# Patient Record
Sex: Female | Born: 1985 | Hispanic: No | Marital: Married | Smoking: Never smoker
Health system: Southern US, Community
[De-identification: ages and names within clinical notes are randomized; demographics above are authoritative.]

## PROBLEM LIST (undated history)

## (undated) DIAGNOSIS — Z789 Other specified health status: Secondary | ICD-10-CM

## (undated) HISTORY — DX: Other specified health status: Z78.9

## (undated) HISTORY — PX: NO PAST SURGERIES: SHX2092

---

## 2019-09-19 LAB — OB RESULTS CONSOLE RPR: RPR: NONREACTIVE

## 2019-09-19 LAB — OB RESULTS CONSOLE ANTIBODY SCREEN: Antibody Screen: NEGATIVE

## 2019-09-19 LAB — OB RESULTS CONSOLE GC/CHLAMYDIA
Chlamydia: NEGATIVE
Gonorrhea: NEGATIVE

## 2019-09-19 LAB — OB RESULTS CONSOLE HIV ANTIBODY (ROUTINE TESTING): HIV: NONREACTIVE

## 2019-09-19 LAB — OB RESULTS CONSOLE HEPATITIS B SURFACE ANTIGEN: Hepatitis B Surface Ag: NEGATIVE

## 2019-09-19 LAB — OB RESULTS CONSOLE ABO/RH: RH Type: POSITIVE

## 2019-09-19 LAB — OB RESULTS CONSOLE RUBELLA ANTIBODY, IGM: Rubella: IMMUNE

## 2019-10-18 NOTE — L&D Delivery Note (Signed)
Delivery Note Complete at 3 pm and pushing since 3.10 pm. Tight perineum. At 4:23 PM a viable and healthy female was delivered via Vaginal, Spontaneous (Presentation: Right Occiput Anterior).  APGAR: 9, 9; weight  -pending    Retained placenta at 30 min, no signs of separation/ bleeding, tried stopping pitocin to allow cervix to open back up with no success. . Dr Alberteen Spindle and Annabelle Harman, CRNA called in with Nitroglycerine. Seven sprays of sublingual Nitroglycerine given gradually while monitoring vitals and gave 500 cc LR bolus, she also received some additional Lidocaine in epidural by Dr Hyacinth Meeker.  Placenta status: Retained; Manual removal. Placenta removed piecemeal, Uterine fundal exploration done to complete removal of any retained pieces.  2 gm Ancef given for infection prophylaxis.   Post=op vital stable, no active bleeding. Uterus firm, pitocin restarted.  BP 111/62   Pulse 74   Temp 97.7 F (36.5 C) (Oral)   Resp 16   Ht 5\' 5"  (1.651 m)   Wt 72.4 kg   SpO2 100%   BMI 26.56 kg/m   Cord: 3 vessels with the following complications: None.  Cord pH: NA Anesthesia: Epidural Episiotomy: None Lacerations: 1st degree perineal ;2nd degree vaginal  Suture Repair: 3.0 vicryl rapide Est. Blood Loss (mL):  550  Mom to postpartum.  Baby to Couplet care / Skin to Skin.  04/12/2020, 5:31 PM

## 2020-03-25 LAB — OB RESULTS CONSOLE GBS: GBS: NEGATIVE

## 2020-04-02 ENCOUNTER — Encounter (HOSPITAL_COMMUNITY): Payer: Self-pay | Admitting: *Deleted

## 2020-04-02 ENCOUNTER — Telehealth (HOSPITAL_COMMUNITY): Payer: Self-pay | Admitting: *Deleted

## 2020-04-02 NOTE — Telephone Encounter (Signed)
Preadmission screen  

## 2020-04-06 ENCOUNTER — Encounter (HOSPITAL_COMMUNITY): Payer: Self-pay | Admitting: *Deleted

## 2020-04-06 ENCOUNTER — Telehealth (HOSPITAL_COMMUNITY): Payer: Self-pay | Admitting: *Deleted

## 2020-04-06 NOTE — Telephone Encounter (Signed)
Preadmission screen  

## 2020-04-08 ENCOUNTER — Other Ambulatory Visit: Payer: Self-pay | Admitting: Obstetrics and Gynecology

## 2020-04-10 ENCOUNTER — Other Ambulatory Visit (HOSPITAL_COMMUNITY)
Admission: RE | Admit: 2020-04-10 | Discharge: 2020-04-10 | Disposition: A | Discharge status: Home / Self Care | Payer: Federal, State, Local not specified - PPO | Source: Ambulatory Visit | Attending: Obstetrics and Gynecology | Admitting: Obstetrics and Gynecology

## 2020-04-10 DIAGNOSIS — Z01812 Encounter for preprocedural laboratory examination: Secondary | ICD-10-CM | POA: Insufficient documentation

## 2020-04-10 DIAGNOSIS — Z20822 Contact with and (suspected) exposure to covid-19: Secondary | ICD-10-CM | POA: Insufficient documentation

## 2020-04-10 LAB — SARS CORONAVIRUS 2 (TAT 6-24 HRS): SARS Coronavirus 2: NEGATIVE

## 2020-04-12 ENCOUNTER — Inpatient Hospital Stay (HOSPITAL_COMMUNITY): Payer: Federal, State, Local not specified - PPO | Admitting: Anesthesiology

## 2020-04-12 ENCOUNTER — Inpatient Hospital Stay (HOSPITAL_COMMUNITY)
Admission: RE | Admit: 2020-04-12 | Discharge: 2020-04-14 | DRG: 797 | Disposition: A | Discharge status: Home / Self Care | Payer: Federal, State, Local not specified - PPO | Attending: Obstetrics and Gynecology | Admitting: Obstetrics and Gynecology

## 2020-04-12 ENCOUNTER — Other Ambulatory Visit: Payer: Self-pay

## 2020-04-12 ENCOUNTER — Encounter (HOSPITAL_COMMUNITY): Payer: Self-pay | Admitting: Obstetrics and Gynecology

## 2020-04-12 ENCOUNTER — Inpatient Hospital Stay (HOSPITAL_COMMUNITY): Payer: Federal, State, Local not specified - PPO

## 2020-04-12 DIAGNOSIS — Z3490 Encounter for supervision of normal pregnancy, unspecified, unspecified trimester: Secondary | ICD-10-CM | POA: Diagnosis present

## 2020-04-12 DIAGNOSIS — Z3A39 39 weeks gestation of pregnancy: Secondary | ICD-10-CM

## 2020-04-12 DIAGNOSIS — D62 Acute posthemorrhagic anemia: Secondary | ICD-10-CM | POA: Diagnosis not present

## 2020-04-12 DIAGNOSIS — Z20822 Contact with and (suspected) exposure to covid-19: Secondary | ICD-10-CM | POA: Diagnosis present

## 2020-04-12 DIAGNOSIS — O365930 Maternal care for other known or suspected poor fetal growth, third trimester, not applicable or unspecified: Secondary | ICD-10-CM | POA: Diagnosis present

## 2020-04-12 DIAGNOSIS — O9902 Anemia complicating childbirth: Secondary | ICD-10-CM | POA: Diagnosis present

## 2020-04-12 LAB — CBC
HCT: 36 % (ref 36.0–46.0)
Hemoglobin: 11.6 g/dL — ABNORMAL LOW (ref 12.0–15.0)
MCH: 28.9 pg (ref 26.0–34.0)
MCHC: 32.2 g/dL (ref 30.0–36.0)
MCV: 89.6 fL (ref 80.0–100.0)
Platelets: 170 10*3/uL (ref 150–400)
RBC: 4.02 MIL/uL (ref 3.87–5.11)
RDW: 12.9 % (ref 11.5–15.5)
WBC: 10.2 10*3/uL (ref 4.0–10.5)
nRBC: 0 % (ref 0.0–0.2)

## 2020-04-12 LAB — ABO/RH: ABO/RH(D): O POS

## 2020-04-12 LAB — TYPE AND SCREEN
ABO/RH(D): O POS
Antibody Screen: NEGATIVE

## 2020-04-12 LAB — RPR: RPR Ser Ql: NONREACTIVE

## 2020-04-12 MED ORDER — CEFAZOLIN SODIUM-DEXTROSE 2-4 GM/100ML-% IV SOLN
INTRAVENOUS | Status: AC
  Filled 2020-04-12: qty 100

## 2020-04-12 MED ORDER — BENZOCAINE-MENTHOL 20-0.5 % EX AERO
1.0000 "application " | INHALATION_SPRAY | CUTANEOUS | Status: DC | PRN
  Administered 2020-04-12: 1 via TOPICAL
  Filled 2020-04-12: qty 56

## 2020-04-12 MED ORDER — FENTANYL-BUPIVACAINE-NACL 0.5-0.125-0.9 MG/250ML-% EP SOLN
12.0000 mL/h | EPIDURAL | Status: DC | PRN
  Filled 2020-04-12: qty 250

## 2020-04-12 MED ORDER — TETANUS-DIPHTH-ACELL PERTUSSIS 5-2.5-18.5 LF-MCG/0.5 IM SUSP: 0.5000 mL | Freq: Once | INTRAMUSCULAR | Status: DC

## 2020-04-12 MED ORDER — NITROGLYCERIN 0.4 MG/SPRAY TL SOLN
Status: DC | PRN
  Administered 2020-04-12 (×3): 2 via SUBLINGUAL

## 2020-04-12 MED ORDER — WITCH HAZEL-GLYCERIN EX PADS: 1.0000 "application " | MEDICATED_PAD | CUTANEOUS | Status: DC | PRN

## 2020-04-12 MED ORDER — DIBUCAINE (PERIANAL) 1 % EX OINT: 1.0000 "application " | TOPICAL_OINTMENT | CUTANEOUS | Status: DC | PRN

## 2020-04-12 MED ORDER — LIDOCAINE HCL (PF) 1 % IJ SOLN: 30.0000 mL | INTRAMUSCULAR | Status: DC | PRN

## 2020-04-12 MED ORDER — SODIUM BICARBONATE 8.4 % IV SOLN
INTRAVENOUS | Status: DC | PRN
  Administered 2020-04-12: 5 mL via EPIDURAL

## 2020-04-12 MED ORDER — SENNOSIDES-DOCUSATE SODIUM 8.6-50 MG PO TABS
2.0000 | ORAL_TABLET | ORAL | Status: DC
  Administered 2020-04-13: 2 via ORAL
  Filled 2020-04-12 (×2): qty 2

## 2020-04-12 MED ORDER — ONDANSETRON HCL 4 MG PO TABS: 4.0000 mg | ORAL_TABLET | ORAL | Status: DC | PRN

## 2020-04-12 MED ORDER — ACETAMINOPHEN 325 MG PO TABS
650.0000 mg | ORAL_TABLET | ORAL | Status: DC | PRN
  Administered 2020-04-13 – 2020-04-14 (×5): 650 mg via ORAL
  Filled 2020-04-12 (×5): qty 2

## 2020-04-12 MED ORDER — IBUPROFEN 600 MG PO TABS
600.0000 mg | ORAL_TABLET | Freq: Four times a day (QID) | ORAL | Status: DC
  Administered 2020-04-12 – 2020-04-14 (×7): 600 mg via ORAL
  Filled 2020-04-12 (×7): qty 1

## 2020-04-12 MED ORDER — OXYTOCIN-SODIUM CHLORIDE 30-0.9 UT/500ML-% IV SOLN
1.0000 m[IU]/min | INTRAVENOUS | Status: DC
  Filled 2020-04-12: qty 500

## 2020-04-12 MED ORDER — EPHEDRINE 5 MG/ML INJ: 10.0000 mg | INTRAVENOUS | Status: DC | PRN

## 2020-04-12 MED ORDER — LACTATED RINGERS IV SOLN
500.0000 mL | Freq: Once | INTRAVENOUS | Status: AC
  Administered 2020-04-12: 500 mL via INTRAVENOUS

## 2020-04-12 MED ORDER — BUTORPHANOL TARTRATE 1 MG/ML IJ SOLN
INTRAMUSCULAR | Status: AC
  Filled 2020-04-12: qty 1

## 2020-04-12 MED ORDER — PHENYLEPHRINE 40 MCG/ML (10ML) SYRINGE FOR IV PUSH (FOR BLOOD PRESSURE SUPPORT): 80.0000 ug | PREFILLED_SYRINGE | INTRAVENOUS | Status: DC | PRN

## 2020-04-12 MED ORDER — PHENYLEPHRINE HCL (PRESSORS) 10 MG/ML IV SOLN
INTRAVENOUS | Status: DC | PRN
Start: 2020-04-12 — End: 2020-04-12
  Administered 2020-04-12 (×3): 40 ug via INTRAVENOUS

## 2020-04-12 MED ORDER — SOD CITRATE-CITRIC ACID 500-334 MG/5ML PO SOLN: 30.0000 mL | ORAL | Status: DC | PRN

## 2020-04-12 MED ORDER — NITROGLYCERIN 0.4 MG/SPRAY TL SOLN
Status: AC
  Filled 2020-04-12: qty 4.9

## 2020-04-12 MED ORDER — BUTORPHANOL TARTRATE 1 MG/ML IJ SOLN
1.0000 mg | INTRAMUSCULAR | Status: DC | PRN
  Administered 2020-04-12: 1 mg via INTRAVENOUS

## 2020-04-12 MED ORDER — OXYTOCIN-SODIUM CHLORIDE 30-0.9 UT/500ML-% IV SOLN: 2.5000 [IU]/h | INTRAVENOUS | Status: DC

## 2020-04-12 MED ORDER — TERBUTALINE SULFATE 1 MG/ML IJ SOLN
0.2500 mg | Freq: Once | INTRAMUSCULAR | Status: AC | PRN
  Administered 2020-04-12: 0.25 mg via SUBCUTANEOUS
  Filled 2020-04-12: qty 1

## 2020-04-12 MED ORDER — CEFAZOLIN SODIUM-DEXTROSE 2-4 GM/100ML-% IV SOLN: 2.0000 g | Freq: Three times a day (TID) | INTRAVENOUS | Status: DC

## 2020-04-12 MED ORDER — CEFAZOLIN SODIUM-DEXTROSE 2-4 GM/100ML-% IV SOLN
2.0000 g | Freq: Once | INTRAVENOUS | Status: AC
  Administered 2020-04-12: 2 g via INTRAVENOUS

## 2020-04-12 MED ORDER — DIPHENHYDRAMINE HCL 25 MG PO CAPS: 25.0000 mg | ORAL_CAPSULE | Freq: Four times a day (QID) | ORAL | Status: DC | PRN

## 2020-04-12 MED ORDER — SIMETHICONE 80 MG PO CHEW: 80.0000 mg | CHEWABLE_TABLET | ORAL | Status: DC | PRN

## 2020-04-12 MED ORDER — DIPHENHYDRAMINE HCL 50 MG/ML IJ SOLN: 12.5000 mg | INTRAMUSCULAR | Status: DC | PRN

## 2020-04-12 MED ORDER — LACTATED RINGERS IV SOLN: INTRAVENOUS | Status: DC

## 2020-04-12 MED ORDER — LIDOCAINE-EPINEPHRINE (PF) 2 %-1:200000 IJ SOLN
INTRAMUSCULAR | Status: AC
  Filled 2020-04-12: qty 10

## 2020-04-12 MED ORDER — ONDANSETRON HCL 4 MG/2ML IJ SOLN
4.0000 mg | Freq: Four times a day (QID) | INTRAMUSCULAR | Status: DC | PRN
  Administered 2020-04-12: 4 mg via INTRAVENOUS
  Filled 2020-04-12: qty 2

## 2020-04-12 MED ORDER — SODIUM CHLORIDE (PF) 0.9 % IJ SOLN
INTRAMUSCULAR | Status: DC | PRN
  Administered 2020-04-12: 12 mL/h via EPIDURAL

## 2020-04-12 MED ORDER — LIDOCAINE HCL (PF) 1 % IJ SOLN
INTRAMUSCULAR | Status: DC | PRN
  Administered 2020-04-12: 11 mL via EPIDURAL

## 2020-04-12 MED ORDER — PHENYLEPHRINE 40 MCG/ML (10ML) SYRINGE FOR IV PUSH (FOR BLOOD PRESSURE SUPPORT)
PREFILLED_SYRINGE | INTRAVENOUS | Status: AC
  Filled 2020-04-12: qty 10

## 2020-04-12 MED ORDER — OXYTOCIN BOLUS FROM INFUSION
333.0000 mL | Freq: Once | INTRAVENOUS | Status: AC
  Administered 2020-04-12 (×2): 333 mL via INTRAVENOUS

## 2020-04-12 MED ORDER — BUTORPHANOL TARTRATE 1 MG/ML IJ SOLN
1.0000 mg | Freq: Once | INTRAMUSCULAR | Status: AC
  Administered 2020-04-12: 1 mg via INTRAVENOUS
  Filled 2020-04-12: qty 1

## 2020-04-12 MED ORDER — MISOPROSTOL 25 MCG QUARTER TABLET
25.0000 ug | ORAL_TABLET | ORAL | Status: DC | PRN
  Administered 2020-04-12: 25 ug via VAGINAL
  Filled 2020-04-12: qty 1

## 2020-04-12 MED ORDER — PRENATAL MULTIVITAMIN CH
1.0000 | ORAL_TABLET | Freq: Every day | ORAL | Status: DC
  Administered 2020-04-13 – 2020-04-14 (×2): 1 via ORAL
  Filled 2020-04-12 (×2): qty 1

## 2020-04-12 MED ORDER — LACTATED RINGERS IV SOLN
500.0000 mL | INTRAVENOUS | Status: DC | PRN
  Administered 2020-04-12: 1000 mL via INTRAVENOUS

## 2020-04-12 MED ORDER — COCONUT OIL OIL: 1.0000 "application " | TOPICAL_OIL | Status: DC | PRN

## 2020-04-12 MED ORDER — ACETAMINOPHEN 325 MG PO TABS
650.0000 mg | ORAL_TABLET | ORAL | Status: DC | PRN
  Administered 2020-04-12 (×2): 650 mg via ORAL
  Filled 2020-04-12 (×2): qty 2

## 2020-04-12 MED ORDER — PHENYLEPHRINE 40 MCG/ML (10ML) SYRINGE FOR IV PUSH (FOR BLOOD PRESSURE SUPPORT)
80.0000 ug | PREFILLED_SYRINGE | INTRAVENOUS | Status: DC | PRN
  Filled 2020-04-12: qty 10

## 2020-04-12 MED ORDER — ONDANSETRON HCL 4 MG/2ML IJ SOLN: 4.0000 mg | INTRAMUSCULAR | Status: DC | PRN

## 2020-04-12 NOTE — Lactation Note (Signed)
This note was copied from a baby's chart. Lactation Consultation Note  Patient Name: Kayla Gordon IWLNL'G Date: 04/12/2020 Reason for consult: Initial assessment;1st time breastfeeding;Term;Infant < 6lbs P1, 6 hour term female infant, borderline IUGR.  Mom with hx: PCOS and Anemia. Mom will work on latching infant at breast, will hand express and give back volume if infant not latching. Parent prefer to supplement infant with donor breastmilk if infant needs to be supplemented due to weight less than 6 lbs at birth, infant will be reassess tomorrow morning to see if supplement is needed. Mom will work on latching infant at breast, hand expressing , giving back volume and pumping.  Tools given : 20 mm NS by L&D and DEBP due infant not latching well currently and hx of PCOS Per mom, infant breastfeed with NS in L&D for 20 minutes. Mom attempted to latch infant but infant was reluctant to latch at this time, only did few suckles and mostly held breast in mouth. Mom used  20 mm NS and LC  inserted 0.5 ml of colostrum with curve tip syringe, mom BF infant on her left breast using the foot ball hold, infant suckle 2 minutes less and stopped.  Infant was given 5 mls of colostrum by spoon.  Mom will continue working towards latching infant at breast and will ask RN or LC for assistance with latching infant at breast if needed. Mom will use DEBP every 3 hours for 15 minutes on initial setting to help establish milk supply due to hx PCOS and infant not latching at breast currently. Mom shown how to use DEBP & how to disassemble, clean, & reassemble parts. Mom knows to breastfeed infant according hunger cues, 8 to 12+ times within 24 hours and not exceed 3 hours without BF infant. Mom made aware of O/P services, breastfeeding support groups, community resources, and our phone # for post-discharge questions.  Mom's plan: 1. Pre-pump breast prior to latching infant at breast will try to latch without  NS at first. 2. Will pump every 3 hours for 15 minutes on initial setting. 3. Parents will continue to do as much STS as possible.  Maternal Data Formula Feeding for Exclusion: No Has patient been taught Hand Expression?: Yes Does the patient have breastfeeding experience prior to this delivery?: No  Feeding Feeding Type: Breast Fed  LATCH Score Latch: Too sleepy or reluctant, no latch achieved, no sucking elicited.  Audible Swallowing: None  Type of Nipple: Everted at rest and after stimulation  Comfort (Breast/Nipple): Soft / non-tender  Hold (Positioning): Assistance needed to correctly position infant at breast and maintain latch.  LATCH Score: 5  Interventions Interventions: Breast feeding basics reviewed;Reverse pressure;Assisted with latch;Breast compression;Skin to skin;Adjust position;Breast massage;Support pillows;Hand express;Position options;Pre-pump if needed;Expressed milk;Hand pump;DEBP  Lactation Tools Discussed/Used Tools: Pump;Nipple Shields Nipple shield size: 20 Breast pump type: Double-Electric Breast Pump;Manual WIC Program: No Pump Review: Setup, frequency, and cleaning;Milk Storage Initiated by:: Kayla Gordon, IBCLC Date initiated:: 04/12/20   Consult Status Consult Status: Follow-up Date: 04/13/20 Follow-up type: In-patient    Kayla Gordon 04/12/2020, 11:25 PM

## 2020-04-12 NOTE — Progress Notes (Addendum)
Kayla Gordon is a 34 y.o. G1P0 at [redacted]w[redacted]d by ultrasound in 1st trim. admitted for induction of labor due to Elective at term, admitted by Dr Amado Nash.  Cytotec x 1 dose overnight, contracting well since then, so no further augmentation thus far. S/p 2 doses of Stadol earlier and now epidural since 10.30 am.  UCs spaced out at 12 noon, AROM at12.30 pm.clear fluid. UCs pattern became regular again  RN Called at 1.30 pm reporting few late decels with UCs q min,so Terbutaline 0.25 mg SQ given and FHT improved,no late decels noted.    Physical exam:  A&O x 3, no acute distress. Pleasant HEENT neg, no thyromegaly Lungs CTA bilat CV RRR, S1S2 normal Abdo soft, non tender, non acute Extr no edema/ tenderness  FHT:  FHR: 130 bpm, variability: moderate,  accelerations:  Present,  decelerations:  Absent UC:   regular, every 2-3 minutes, spontaneous  SVE:   Dilation: 3 Effacement (%): 90 Station: -2, -1 Exam by:: Dr Juliene Pina This exam was at 12.30 pm at the time of AROM. Mid pelvis borderline. Plan to use Peanut ball once further dilated and head in mid-pelvis   Assessment / Plan: IOL at term. Good early progress Labor: Progressing normally Fetal Wellbeing:  Category I Pain Control:  Epidural I/D:  n/a Anticipated MOD:  working towards SVD, continue to assess   Robley Fries 04/12/2020, 2:43 PM

## 2020-04-12 NOTE — Anesthesia Preprocedure Evaluation (Signed)

## 2020-04-12 NOTE — Anesthesia Procedure Notes (Signed)
Epidural Patient location during procedure: OB Start time: 04/12/2020 10:07 AM End time: 04/12/2020 10:22 AM  Staffing Anesthesiologist: Lowella Curb, MD Performed: anesthesiologist   Preanesthetic Checklist Completed: patient identified, IV checked, site marked, risks and benefits discussed, surgical consent, monitors and equipment checked, pre-op evaluation and timeout performed  Epidural Patient position: sitting Prep: ChloraPrep Patient monitoring: heart rate, cardiac monitor, continuous pulse ox and blood pressure Approach: midline Location: L2-L3 Injection technique: LOR saline  Needle:  Needle type: Tuohy  Needle gauge: 17 G Needle length: 9 cm Needle insertion depth: 4 cm Catheter type: closed end flexible Catheter size: 20 Guage Catheter at skin depth: 8 cm Test dose: negative  Assessment Events: blood not aspirated, injection not painful, no injection resistance, no paresthesia and negative IV test  Additional Notes Reason for block:procedure for pain

## 2020-04-12 NOTE — H&P (Signed)
Kayla Gordon is a 34 y.o. G1P0 at [redacted]w[redacted]d gestation presents for IOL for lagging growth/borderline iugr.  Antepartum course: complicated with lagging growth, efw 11%, antenatal testing reassuring  [facility] 9 wks.  See complete pre-natal records  History OB History    Gravida  1   Para      Term      Preterm      AB      Living        SAB      TAB      Ectopic      Multiple      Live Births             Past Medical History:  Diagnosis Date  . Medical history non-contributory    Past Surgical History:  Procedure Laterality Date  . NO PAST SURGERIES     Family History: family history includes Brain cancer in her paternal grandmother; Heart attack in her maternal grandfather; Liver cancer in her paternal grandmother; Lung cancer in her paternal grandmother; Stroke in her maternal grandmother. Social History:  reports that she has never smoked. She has never used smokeless tobacco. She reports previous alcohol use. She reports that she does not use drugs.  ROS: See above otherwise negative  Prenatal labs:  ABO, Rh: --/--/O POS, O POS Performed at [facility], [address] [place], [place] [address]  [phone]/27 0100) Antibody: NEG (06/27 0100) Rubella: Immune (12/03 0000) RPR: NON REACTIVE (06/27 0015)  HBsAg: Negative (12/03 0000)  HIV:Non-reactive (12/03 0000)  GBS: Negative/-- (06/09 0000)  1 hr Glucola: Normal Genetic screening: Normal Anatomy US: Normal  Physical Exam:  Blood pressure 111/62, pulse 74, temperature 97.7 F (36.5 C), temperature source Oral, resp. rate 16, height 5\' 5"  (1.651 m), weight 72.4 kg, SpO2 100 %.  See note by Dr  Labs:  CBC:  Lab Results  Component Value Date   WBC 10.2 04/12/2020   RBC 4.02 04/12/2020   HGB 11.6 (L) 04/12/2020   HCT 36.0 04/12/2020   MCV 89.6 04/12/2020   MCH 28.9 04/12/2020   MCHC 32.2 04/12/2020   RDW 12.9 04/12/2020   PLT 170 04/12/2020   CMP: No  results found for: NA, K, CL, CO2, GLUCOSE, BUN, CREATININE, CALCIUM, PROT, AST, ALT, ALBUMIN, ALKPHOS, BILITOT, GFRNONAA, GFRAA, ANIONGAP Urine: No results found for: COLORURINE, APPEARANCEUR, LABSPEC, PHURINE, GLUCOSEU, HGBUR, BILIRUBINUR, KETONESUR, PROTEINUR, NITRITE, LEUKOCYTESUR   Prenatal Transfer Tool  Maternal Diabetes: No Genetic Screening: Normal Maternal Ultrasounds/Referrals: Normal Fetal Ultrasounds or other Referrals:  None Maternal Substance Abuse:  No Significant Maternal Medications:  None Significant Maternal Lab Results: Group B Strep negative    Assessment/Plan:  34 y.o. G1P0 at [redacted]w[redacted]d gestation   1. IOL for borderline iugr/lagging growth - planned cytotec and pitocin.  See Dr [redacted]w[redacted]d notes for arom and precipitous delivery. 2. gbs neg 3. Rh pos   Kayla Gordon 04/12/2020, 5:24 PM

## 2020-04-13 LAB — CBC
HCT: 33.6 % — ABNORMAL LOW (ref 36.0–46.0)
Hemoglobin: 11 g/dL — ABNORMAL LOW (ref 12.0–15.0)
MCH: 29.6 pg (ref 26.0–34.0)
MCHC: 32.7 g/dL (ref 30.0–36.0)
MCV: 90.3 fL (ref 80.0–100.0)
Platelets: 139 10*3/uL — ABNORMAL LOW (ref 150–400)
RBC: 3.72 MIL/uL — ABNORMAL LOW (ref 3.87–5.11)
RDW: 13.2 % (ref 11.5–15.5)
WBC: 15 10*3/uL — ABNORMAL HIGH (ref 4.0–10.5)
nRBC: 0 % (ref 0.0–0.2)

## 2020-04-13 NOTE — Progress Notes (Signed)
PPD #1, SVD, 1st degree perineal, 2nd degree vaginal, s/p retained placenta with manual extraction, baby girl "Lana"  S:  Reports feeling sore and tired, but happy. C/o soreness with standing, cramping and inner tight MSK discomfort             Tolerating po/ No nausea or vomiting / Denies dizziness or SOB             Bleeding is moderate to light; no clots             Pain controlled withTylenol and Motrin             Up ad lib / ambulatory / voiding QS without difficulty   Newborn breast feeding - working with lactation on latch, baby is sleepy, but per parents voiding/stooling so feels hopeful baby is getting colostrum   O:               VS: BP 102/61 (BP Location: Left Arm)   Pulse 60   Temp 97.9 F (36.6 C) (Oral)   Resp 18   Ht 5\' 5"  (1.651 m)   Wt 72.4 kg   SpO2 98%   Breastfeeding Unknown   BMI 26.56 kg/m  Patient Vitals for the past 24 hrs:  BP Temp Temp src Pulse Resp SpO2  04/13/20 0644 102/61 97.9 F (36.6 C) Oral 60 18 98 %  04/13/20 0315 131/70 (!) 97.5 F (36.4 C) Oral (!) 54 20 100 %  04/12/20 2337 (!) 115/59 98.5 F (36.9 C) Oral 62 20 98 %  04/12/20 2145 124/65 98.6 F (37 C) Oral 63 20 98 %  04/12/20 2115 122/66 -- -- 80 16 --  04/12/20 2101 119/71 -- -- 66 -- --  04/12/20 2059 111/60 -- -- 72 -- --  04/12/20 2056 115/68 -- -- 61 -- --  04/12/20 2007 118/78 -- -- 64 16 --  04/12/20 1901 109/60 -- -- 66 15 --  04/12/20 1846 (!) 110/57 -- -- 62 16 --  04/12/20 1836 (!) 112/54 -- -- (!) 120 16 --  04/12/20 1832 (!) 81/38 -- -- (!) 191 20 --  04/12/20 1829 (!) 94/59 -- -- 75 16 --  04/12/20 1816 91/61 -- -- (!) 101 16 --  04/12/20 1801 (!) 100/51 -- -- 64 16 --  04/12/20 1753 (!) 104/45 -- -- 66 16 --  04/12/20 1737 (!) 96/56 -- -- 72 16 --  04/12/20 1735 (!) 105/49 -- -- 70 16 --  04/12/20 1733 (!) 95/50 -- -- 71 16 --  04/12/20 1732 103/60 -- -- 69 16 --  04/12/20 1728 102/65 -- -- (!) 131 16 --  04/12/20 1726 (!) 95/39 -- -- 72 16 --  04/12/20 1725  (!) 83/35 -- -- 79 16 --  04/12/20 1724 (!) 83/38 -- -- 83 16 --  04/12/20 1716 94/61 -- -- 84 16 --  04/12/20 1715 (!) 76/48 -- -- (!) 105 20 --  04/12/20 1710 (!) 110/51 -- -- 88 16 97 %  04/12/20 1707 102/61 -- -- 81 16 --  04/12/20 1705 -- -- -- -- -- 97 %  04/12/20 1703 105/73 -- -- (!) 115 16 --  04/12/20 1646 111/62 -- -- 74 16 --  04/12/20 1632 (!) 107/54 -- -- 73 16 --  04/12/20 1532 (!) 116/99 -- -- 75 18 --  04/12/20 1430 114/62 -- -- 70 16 --  04/12/20 1401 109/61 97.7 F (36.5 C) Oral 66 16 --  04/12/20 1337 110/62 -- -- Marland Kitchen  54 16 --  04/12/20 1230 (!) 95/55 97.7 F (36.5 C) Oral (!) 51 16 --  04/12/20 1201 (!) 94/50 -- -- (!) 48 16 --  04/12/20 1131 (!) 99/59 -- -- (!) 50 16 --  04/12/20 1109 (!) 105/52 -- -- (!) 46 16 --  04/12/20 1101 107/70 -- -- (!) 50 16 --  04/12/20 1056 102/60 -- -- (!) 51 16 --  04/12/20 1055 -- -- -- -- -- 100 %  04/12/20 1051 104/90 -- -- 91 16 --  04/12/20 1050 -- -- -- -- -- 99 %  04/12/20 1046 116/69 -- -- (!) 51 16 --  04/12/20 1045 -- -- -- -- -- 99 %  04/12/20 1041 118/72 -- -- (!) 56 16 --  04/12/20 1040 -- -- -- -- -- 99 %  04/12/20 1035 (!) 101/58 -- -- (!) 50 16 99 %    LABS:              Recent Labs    04/12/20 0015 04/13/20 0406  WBC 10.2 15.0*  HGB 11.6* 11.0*  PLT 170 139*               Blood type: --/--/O POS, O POS Performed at [facility], [address] [place], [place] [address]  [phone]/27 0100)  Rubella: Immune (12/03 0000)                     I&O: Intake/Output      06/27 0701 - 06/28 0700 06/28 0701 - 06/29 0700   I.V. (mL/kg) 0 (0)    Other 0    Total Intake(mL/kg) 0 (0)    Urine (mL/kg/hr) 2875 (1.7)    Blood 476    Total Output 3351    Net -3351         Urine Occurrence 2 x                  Physical Exam:             Alert and oriented X3  Lungs: Clear and unlabored  Heart: regular rate and rhythm / no murmurs  Abdomen: soft, non-tender, non-distended              Fundus: firm, (+)  mild tenderness, U-1  Perineum: well approximated 1st degree perineal, mild edema, no evidence of hematoma   Lochia: small rubra on ice pack pad  Extremities: no edema, no calf pain or tenderness    A/P: PPD # 1, SVD   1st degree perineal, 2nd degree vaginal   - Dermoplast, ice packs, tucks pad    s/p retained placenta with manual extraction   - s/p Ancef given 6/27   Mild ABL Anemia   Doing well - stable status  Routine post partum orders  Lactation support  Encouraged to rest when baby rests  Anticipate d/c home tomorrow   Carlean Jews, MSN, CNM [facility]

## 2020-04-13 NOTE — Anesthesia Postprocedure Evaluation (Signed)
Anesthesia Post Note  Patient: Kayla Gordon  Procedure(s) Performed: AN AD HOC LABOR EPIDURAL     Patient location during evaluation: Mother Baby Anesthesia Type: Epidural Level of consciousness: awake Pain management: satisfactory to patient Vital Signs Assessment: post-procedure vital signs reviewed and stable Respiratory status: spontaneous breathing Cardiovascular status: stable Anesthetic complications: no   No complications documented.  Last Vitals:  Vitals:   04/13/20 0315 04/13/20 0644  BP: 131/70 102/61  Pulse: (!) 54 60  Resp: 20 18  Temp: (!) 36.4 C 36.6 C  SpO2: 100% 98%    Last Pain:  Vitals:   04/13/20 0743  TempSrc:   PainSc: 4    Pain Goal: Patients Stated Pain Goal: 2 (04/13/20 6295)                 Cephus Shelling

## 2020-04-13 NOTE — Lactation Note (Addendum)
This note was copied from a baby's chart. Lactation Consultation Note  Patient Name: Kayla Gordon MPNTI'R Date: 04/13/2020 Reason for consult: Follow-up assessment Baby 24hrs old, 2% wt loss, mom first time breastfeeder. Mom sitting in bed attempting to latch baby, dad at bedside, sister of dad sitting on couch, 2 RNs in room giving report. Mom reports baby is sleeping during feedings, unable to keep awake for full feeding. Reports last feeding at ~1030a baby fed with multiple sucks at the breast. Mom reports using DEBP, collects a few drops when pumping and ~44mls with hand expression. This LC hand expressed ~0.81ml from left breast, finger fed to baby, baby with moderate rhythmic suck, mom latched baby to right breast football hold using 38mm nipple shield without assistance, baby gave 1-2 sucks, remained latched but no additional sucks. Discussed DBM, mom agreed, consent signed. Fed DBM to baby with gloved finger, baby more alert, transferred to left breast cross cradle, dad gave remaining DBM via curved tip syringe while baby latched total 20ml given for this feeding. Baby burped and content after feeding, encouraged optimal skin to skin, cue based feedings, wake if >3hrs since last feeding, discussed signs of adequate milk transfer, offer DBM prior to latching to breast (or offer half before and remaining after latching, based on baby's behavior), mom to continue to pump after feedings and offer EBM to baby, Breastfeeding supplementation sheet given. Preparation and storage of DBM reviewed. Mom and dad voiced understanding and agreeable with plan, aware will re-evaluate plan in 24hrs, aware to call for additional assistance as needed. Upon leaving the room baby swaddled and asleep with sister in law, dad assisting mom to the bathroom. BGilliam, RN, IBCLC  AddendumJonette Eva Lot# [id number]  Maternal Data    Feeding Feeding Type: Breast Fed (with DBM)  LATCH Score Latch: Repeated attempts needed to  sustain latch, nipple held in mouth throughout feeding, stimulation needed to elicit sucking reflex.  Audible Swallowing: A few with stimulation with DBM  Type of Nipple: Everted at rest and after stimulation  Comfort (Breast/Nipple): Soft / non-tender  Hold (Positioning): No assistance needed to correctly position infant at breast.  LATCH Score: 8  Interventions Interventions: Assisted with latch;Skin to skin;Breast massage;Hand express;Breast compression;Support pillows;Position options;Expressed milk (NS and DBM)  Lactation Tools Discussed/Used Nipple shield size: 20   Consult Status Consult Status: Follow-up Date: 04/14/20 Follow-up type: In-patient    Charlynn Court 04/13/2020, 4:42 PM

## 2020-04-13 NOTE — Lactation Note (Signed)
This note was copied from a baby's chart. Lactation Consultation Note  Patient Name: Kayla Gordon VFIEP'P Date: 04/13/2020 Reason for consult: Follow-up assessment;Infant < 6lbs   Baby 18 hours old.  39 weeks. < 6 lbs. Mother has been hand expressing and spoon feeding infant up to 1-4 ml of colostrum on spoon. Parents state baby has been latching for approx 5 min.  NP viewed latch and stated latch was shallow. Mother has been pumping q 3 hours per parents. Mother tearful during consult due to being tired states FOB. Encouraged hand expressing before and after pumping and using hands on pumping to help increase supply.  Discussed considering DM if sleepy baby continues.  LC suggest mother rest and lactation will follow up later today.     Maternal Data Has patient been taught Hand Expression?: Yes  Feeding    LATCH Score                   Interventions Interventions: Breast feeding basics reviewed;DEBP;Hand express  Lactation Tools Discussed/Used     Consult Status Consult Status: Follow-up Date: 04/13/20 Follow-up type: In-patient    Dahlia Byes [facility] 04/13/2020, 10:57 AM

## 2020-04-14 LAB — SURGICAL PATHOLOGY

## 2020-04-14 MED ORDER — ACETAMINOPHEN 500 MG PO TABS
1000.0000 mg | ORAL_TABLET | Freq: Four times a day (QID) | ORAL | 2 refills | Status: AC | PRN
Start: 2020-04-14 — End: 2021-04-14

## 2020-04-14 MED ORDER — COCONUT OIL OIL: 1.0000 "application " | TOPICAL_OIL | 0 refills | Status: AC | PRN

## 2020-04-14 MED ORDER — IBUPROFEN 600 MG PO TABS: 600.0000 mg | ORAL_TABLET | Freq: Four times a day (QID) | ORAL | 0 refills | Status: AC

## 2020-04-14 MED ORDER — BENZOCAINE-MENTHOL 20-0.5 % EX AERO: 1.0000 "application " | INHALATION_SPRAY | CUTANEOUS | Status: AC | PRN

## 2020-04-14 MED FILL — ACETAMINOPHEN 500MG XT STRE: 500 | 12 days supply | Qty: 100 | Fill #0

## 2020-04-14 MED FILL — IBUPROFEN 600 MG TABLET: 600 | 7 days supply | Qty: 30 | Fill #0

## 2020-04-14 NOTE — Lactation Note (Addendum)
This note was copied from a baby's chart. Lactation Consultation Note  Patient Name: Kayla Gordon [id number]'I Date: 04/14/2020 Reason for consult: (P) Follow-up assessment;Primapara;1st time breastfeeding;Term;Infant weight loss  Baby is 21 hours old - Planned D/C today per RN 2nd visit to reassess use of the NS for latch and review the written [facility] plan mom and ad requested .  As LC entered the room baby awake, rooting and crying,  LC had dad warn up the EBM , mom applied NS #20 and instilled the EBM in the top  Of the NS and latched the baby on the left breast / football / and baby fed for 20 mins . And the baby's aunt was feeding the baby donor milk from a bottle after the latch.  Baby latched better than the earlier feeding and was able to increase depth.  LC stressed the importance of working with the baby to open wider for a deeper latch.  For D/C instructions stop the curved tip syringe finger feeding and use the artifical nipple  Therapeutically to enhance the baby opening wider for a deeper latch. Baby has a recessed chin. The football position seemed to work better than the cross cradle.  Sore nipple and engorgement prevention and tx reviewed. Mom denies soreness.  Due to areola edema - LC recommended shells between feedings except when sleeping  To increase compressibility of the areola so eventually the baby will be able to latch STS.  LC stressed STS feedings until the baby is back to birth weight, gaining steadily and can stay awake for majority of the feeding.  LC - recommended steps for latching - breast massage, hand express, pre- pump with the hand pump , apply the NS and instill EBM into the top of the nipple Shield.  Latch the baby with firm support and feed for 15 -20 mins ( 30 mins max ) and supplement with at least 30 ml of EBM .  LC stressed the importance of feeding with feeding cues and by 3 hours - 8 - 12 times in 24 hours.  LC offered a LC O/P appt  To be requested/  mom and dad receptive.  Per mom and dad the midwife provided a private LC 's name and company ( [employer] for a visit at home) .  Dad and mom asked the LC to go ahead and place the request for the [facility] O/P at [facility] as plan B just in case the private [facility] can't make a visit,  LC placed a request in the Epic basket for LC O/P.  Mom has the [facility] [employer] pamphlet with phone numbers .     Maternal Data    Feeding    LATCH Score                   Interventions    Lactation Tools Discussed/Used     Consult Status      Kayla Gordon Kayla Gordon 04/14/2020, 1:54 PM

## 2020-04-14 NOTE — Lactation Note (Signed)
This note was copied from a baby's chart. Baby is Lactation Consultation Note  Patient Name: Kayla Gordon Date: 04/14/2020  baby is 27 hours old / 6 % weight loss / @36  hours - 9.2 Bilirubin  Baby in the crib / quiet alert / and mom sitting in the bed pumping both breast  With #27 F and  Per mom comfortable and flange check good.  No EBM yield with pumping.  Per mom has been latching with the NS #20 and supplementing with donor  Milk finger feeding. The best feeding was during the night when baby took 15 ml.  Per mom  has pumped x 4 times in the last 24 hours , to tired last night.  MBURN provided the Shells for mom this am and mom aware to wear then between  Feedings except when sleeping.  LC reinforced to mom the shells are going to elongate the nipple / areola complex  And the NS will fit better.  Baby noted to be gaggy lying in bed and LC burped the baby and used the bulb syringe x1 . Also had a small quarter sized loose mec stool.  Baby rooting and LC offered to assist to latch and showed mom and dad how they could  Instill the EBM into the top of the NS and then latch . Baby fed for 8 mins and released . Milk in the NS. 1st tried to latch without the NS and baby unable to sustain a latch or depth .  LC feels baby needs the NS for now.  After feeding at the breast and the resident checked the baby , LC showed mom how to pace feed the baby , had 1st was gaggy and then switch her to side lying to feed and she tolerated  4 more mil of EBM.  Baby noted to be gasey.  LC recommended offering the breast with feeding cues and feed by 3 hours,  Appetizer of EBM In the nipple shield and latch , have dad ease chin down to increase depth.  Feed for 15 -20 mins 30 mins max and supplement upto 30 ml of EBM with a bottle.   Post pump both breast for 15 -20 mins / save milk and next feeding switch to the other breast.   LC encouraged to call for another feeding assessment today.     Maternal Data    Feeding Feeding Type: Breast Fed  LATCH Score Latch: Grasps breast easily, tongue down, lips flanged, rhythmical sucking.  Audible Swallowing: Spontaneous and intermittent  Type of Nipple: Everted at rest and after stimulation  Comfort (Breast/Nipple): Filling, red/small blisters or bruises, mild/mod discomfort  Hold (Positioning): Assistance needed to correctly position infant at breast and maintain latch.  LATCH Score: 8  Interventions Interventions: Breast feeding basics reviewed;Assisted with latch;Skin to skin;Breast massage;Hand express;Reverse pressure;Breast compression;Adjust position;Support pillows;Position options  Lactation Tools Discussed/Used     Consult Status      [facility] 04/14/2020, 9:18 AM

## 2020-04-14 NOTE — Discharge Summary (Signed)
OB Discharge Summary  Patient Name: Kayla Gordon DOB: [birth year] MRN: [id number]  Date of admission: 04/12/2020 Delivering provider: MODY, VAISHALI   Admitting diagnosis: Encounter for induction of labor [Z34.90] Intrauterine pregnancy: [redacted]w[redacted]d     Secondary diagnosis: Patient Active Problem List   Diagnosis Date Noted  . SVD (spontaneous vaginal delivery) 04/13/2020  . Postpartum care following vaginal delivery (6/27) 04/13/2020  . Second degree vaginal wall laceration 04/13/2020  . First degree perineal laceration 04/13/2020  . Retained placenta without hemorrhage 04/13/2020  . Encounter for induction of labor 04/12/2020   Additional problems: Fetal growth lag 11%  Date of discharge: 04/14/2020   Discharge diagnosis: Principal Problem:   Postpartum care following vaginal delivery (6/27) Active Problems:   Encounter for induction of labor   SVD (spontaneous vaginal delivery)   Second degree vaginal wall laceration   First degree perineal laceration   Retained placenta without hemorrhage                                                              Post partum procedures:none  Augmentation: AROM and Cytotec Pain control: Epidural  Laceration:1st degree;2nd degree  Episiotomy:None  Complications: None  Hospital course:  Induction of Labor With Vaginal Delivery   34 y.o. yo G1P1001 at [redacted]w[redacted]d was admitted to the hospital 04/12/2020 for induction of labor.  Indication for induction: growth lag.  Patient had an uncomplicated labor course as follows: Membrane Rupture Time/Date: 12:17 PM ,04/12/2020   Delivery Method:Vaginal, Spontaneous  Episiotomy: None  Lacerations:  1st degree;2nd degree  Details of delivery can be found in separate delivery note.  Patient had a routine postpartum course. Patient is discharged home 04/14/20.  Newborn Data: Birth date:04/12/2020  Birth time:4:23 PM  Gender:Female  Living status:Living  Apgars:9 ,9  Weight:2640 g   Physical exam   Vitals:   04/13/20 1145 04/13/20 1425 04/13/20 2029 04/14/20 0605  BP: 105/63 118/75 127/73 120/71  Pulse: 60 (!) 55 (!) 55 62  Resp: 18 18 18 18   Temp: 98 F (36.7 C) 97.7 F (36.5 C) 97.7 F (36.5 C) 98.1 F (36.7 C)  TempSrc: Oral Oral Oral Oral  SpO2: 98% 100% 99% 98%  Weight:      Height:       General: alert, cooperative and no distress Lochia: appropriate Uterine Fundus: firm Incision: N/A Perineum: repair intact, no edema DVT Evaluation: No cords or calf tenderness. No significant calf/ankle edema. Labs: Lab Results  Component Value Date   WBC 15.0 (H) 04/13/2020   HGB 11.0 (L) 04/13/2020   HCT 33.6 (L) 04/13/2020   MCV 90.3 04/13/2020   PLT 139 (L) 04/13/2020   No flowsheet data found. Edinburgh Postnatal Depression Scale Screening Tool 04/12/2020  I have been able to laugh and see the funny side of things. (No Data)   Vaccines: TDaP UTD         Flu    UTD  Discharge instruction:  per After Visit Summary,  [facility] booklet and  "Understanding Mother & Baby Care" hospital booklet  After Visit Meds:  Allergies as of 04/14/2020   No Known Allergies     Medication List    TAKE these medications   acetaminophen 500 MG tablet Commonly known as: TYLENOL Take 2 tablets (1,000 mg total)  by mouth every 6 (six) hours as needed.   benzocaine-Menthol 20-0.5 % Aero Commonly known as: DERMOPLAST Apply 1 application topically as needed for irritation (perineal discomfort).   coconut oil Oil Apply 1 application topically as needed.   DHA PO Take 2 capsules by mouth daily.   ibuprofen 600 MG tablet Commonly known as: ADVIL Take 1 tablet (600 mg total) by mouth every 6 (six) hours.   Prenate Elite 20-0.6-0.4 MG Tabs Take 1 tablet by mouth daily.   psyllium 0.52 g capsule Commonly known as: REGULOID Take 0.52 g by mouth daily.            Discharge Care Instructions  (From admission, onward)         Start     Ordered   04/14/20 0000   Discharge wound care:       Comments: Sitz baths 2 times /day with warm water x 1 week. May add herbals: 1 ounce dried comfrey leaf* 1 ounce calendula flowers 1 ounce lavender flowers  Supplies can be found online at [employer] sources at [employer], [employer]  1/2 ounce dried uva ursi leaves 1/2 ounce witch hazel blossoms (if you can find them) 1/2 ounce dried sage leaf 1/2 cup sea salt Directions: Bring 2 quarts of water to a boil. Turn off heat, and place 1 ounce (approximately 1 large handful) of the above mixed herbs (not the salt) into the pot. Steep, covered, for 30 minutes.  Strain the liquid well with a fine mesh strainer, and discard the herb material. Add 2 quarts of liquid to the tub, along with the 1/2 cup of salt. This medicinal liquid can also be made into compresses and peri-rinses.   04/14/20 1048          Diet: routine diet  Activity: Advance as tolerated. Pelvic rest for 6 weeks.   Postpartum contraception: Not Discussed  Newborn Data: Live born female  Birth Weight: 5 lb 13.1 oz (2640 g) APGAR: 9, 9  Newborn Delivery   Birth date/time: 04/12/2020 16:23:00 Delivery type: Vaginal, Spontaneous      named Lana Baby Feeding: Breast Disposition:home with mother   Delivery Report:  Review the Delivery Report for details.    Follow up:  Follow-up Information    Amado Nash Candace Gallus, MD. Schedule an appointment as soon as possible for a visit in 6 week(s).   Specialty: Obstetrics and Gynecology Contact information: [address] [place] [address] [phone]                 Signed: Cipriano Mile, MSN 04/14/2020, 10:51 AM

## 2021-06-27 ENCOUNTER — Emergency Department (HOSPITAL_COMMUNITY)
Admission: EM | Admit: 2021-06-27 | Discharge: 2021-06-28 | Discharge status: Against Medical Advice | Payer: Federal, State, Local not specified - PPO | Attending: Physician Assistant | Admitting: Physician Assistant

## 2021-06-27 ENCOUNTER — Emergency Department (HOSPITAL_COMMUNITY): Payer: Federal, State, Local not specified - PPO

## 2021-06-27 DIAGNOSIS — R1013 Epigastric pain: Secondary | ICD-10-CM | POA: Insufficient documentation

## 2021-06-27 DIAGNOSIS — Z5321 Procedure and treatment not carried out due to patient leaving prior to being seen by health care provider: Secondary | ICD-10-CM | POA: Diagnosis not present

## 2021-06-27 LAB — COMPREHENSIVE METABOLIC PANEL
ALT: 16 U/L (ref 0–44)
AST: 19 U/L (ref 15–41)
Albumin: 3.7 g/dL (ref 3.5–5.0)
Alkaline Phosphatase: 72 U/L (ref 38–126)
Anion gap: 7 (ref 5–15)
BUN: 9 mg/dL (ref 6–20)
CO2: 22 mmol/L (ref 22–32)
Calcium: 8.8 mg/dL — ABNORMAL LOW (ref 8.9–10.3)
Chloride: 107 mmol/L (ref 98–111)
Creatinine, Ser: 0.68 mg/dL (ref 0.44–1.00)
GFR, Estimated: >60 mL/min (ref >60)
Glucose, Bld: 103 mg/dL — ABNORMAL HIGH (ref 70–99)
Potassium: 3.3 mmol/L — ABNORMAL LOW (ref 3.5–5.1)
Sodium: 136 mmol/L (ref 135–145)
Total Bilirubin: 0.7 mg/dL (ref 0.3–1.2)
Total Protein: 7 g/dL (ref 6.5–8.1)

## 2021-06-27 LAB — CBC WITH DIFFERENTIAL/PLATELET
Abs Immature Granulocytes: 0.03 10*3/uL (ref 0.00–0.07)
Basophils Absolute: 0 10*3/uL (ref 0.0–0.1)
Basophils Relative: 0 %
Eosinophils Absolute: 0.1 10*3/uL (ref 0.0–0.5)
Eosinophils Relative: 1 %
HCT: 40.6 % (ref 36.0–46.0)
Hemoglobin: 13.6 g/dL (ref 12.0–15.0)
Immature Granulocytes: 0 %
Lymphocytes Relative: 18 %
Lymphs Abs: 1.7 10*3/uL (ref 0.7–4.0)
MCH: 29.4 pg (ref 26.0–34.0)
MCHC: 33.5 g/dL (ref 30.0–36.0)
MCV: 87.7 fL (ref 80.0–100.0)
Monocytes Absolute: 0.6 10*3/uL (ref 0.1–1.0)
Monocytes Relative: 7 %
Neutro Abs: 6.9 10*3/uL (ref 1.7–7.7)
Neutrophils Relative %: 74 %
Platelets: 187 10*3/uL (ref 150–400)
RBC: 4.63 MIL/uL (ref 3.87–5.11)
RDW: 12.6 % (ref 11.5–15.5)
WBC: 9.4 10*3/uL (ref 4.0–10.5)
nRBC: 0 % (ref 0.0–0.2)

## 2021-06-27 LAB — LIPASE, BLOOD: Lipase: 29 U/L (ref 11–51)

## 2021-06-27 LAB — URINALYSIS, ROUTINE W REFLEX MICROSCOPIC
Bilirubin Urine: NEGATIVE
Glucose, UA: NEGATIVE mg/dL
Hgb urine dipstick: NEGATIVE
Ketones, ur: NEGATIVE mg/dL
Leukocytes,Ua: NEGATIVE
Nitrite: NEGATIVE
Protein, ur: NEGATIVE mg/dL
Specific Gravity, Urine: <1.005 — ABNORMAL LOW (ref 1.005–1.030)
pH: 6 (ref 5.0–8.0)

## 2021-06-27 NOTE — ED Provider Notes (Signed)
Emergency Medicine Provider Triage Evaluation Note  Kayla Gordon , a 35 y.o. female  was evaluated in triage.  Pt complains of epigastric abdominal pain that began approximately 2 to 3 days ago.  Describes the pain as colicky mainly along the epigastric region along with the right upper quadrant.  Does endorse some chills but no fever.  No exacerbating or alleviating factors.  Last meal a grilled cheese sandwich prior to arrival in the ED.  No over-the-counter medication for symptomatic relief.  No prior history of surgical intervention to her abdomen.  Last menstrual period began yesterday or today.  Review of Systems  Positive: Abdominal pain, chills Negative: Fever, nausea, vomiting, urinary symptoms  Physical Exam  There were no vitals taken for this visit. Gen:   Awake, no distress   Resp:  Normal effort  MSK:   Moves extremities without difficulty  Other:  TTP around the right upper quadrant and epigastric region, however there is also generalized.  No CVA tenderness bilaterally  Medical Decision Making  Medically screening exam initiated at 5:11 PM.  Appropriate orders placed.  Kayla Gordon was informed that the remainder of the evaluation will be completed by another provider, this initial triage assessment does not replace that evaluation, and the importance of remaining in the ED until their evaluation is complete.  Patient here with 2 to 3 days of epigastric and right upper quadrant pain.  No nausea or vomiting.  No medications prior to arrival.  No surgical intervention in the past.  Normal bowel movements.  Reports some chills.  Last menstrual period today or yesterday.   Claude Manges, PA-C 06/27/21 1713    Maia Plan, MD 07/04/21 1754

## 2021-06-27 NOTE — ED Notes (Signed)
Pt called for vitals no answer. °

## 2021-06-27 NOTE — ED Triage Notes (Signed)
Pt c/o RUQ, epigastric abd pain x3 days, "colicky" in nature. Denies N/V, endorses diarrhea 1/day, no other stools. Worsening pain w eating, tender to touch. No OTC meds, nothing makes it better or worse. Subjective chills, no fever.

## 2021-06-27 NOTE — ED Notes (Signed)
No response x1  

## 2022-07-20 LAB — OB RESULTS CONSOLE RUBELLA ANTIBODY, IGM: Rubella: IMMUNE

## 2022-07-20 LAB — OB RESULTS CONSOLE RPR: RPR: NONREACTIVE

## 2022-07-20 LAB — OB RESULTS CONSOLE GC/CHLAMYDIA
Chlamydia: NEGATIVE
Neisseria Gonorrhea: NEGATIVE

## 2022-07-20 LAB — OB RESULTS CONSOLE ANTIBODY SCREEN: Antibody Screen: NEGATIVE

## 2022-07-20 LAB — HEPATITIS C ANTIBODY: HCV Ab: NEGATIVE

## 2022-07-20 LAB — OB RESULTS CONSOLE HEPATITIS B SURFACE ANTIGEN: Hepatitis B Surface Ag: NEGATIVE

## 2022-07-20 LAB — OB RESULTS CONSOLE ABO/RH: RH Type: POSITIVE

## 2022-07-20 LAB — OB RESULTS CONSOLE HIV ANTIBODY (ROUTINE TESTING): HIV: NONREACTIVE

## 2022-08-27 IMAGING — US US ABDOMEN LIMITED
1 series · 14 of 25 positions shown · non-contrast
Comparison: None.

CLINICAL DATA: Right upper quadrant pain.

EXAM:
ULTRASOUND ABDOMEN LIMITED RIGHT UPPER QUADRANT

[Series 1: us abdomen limited · 14 of 74 slices shown]
[im 1/74]
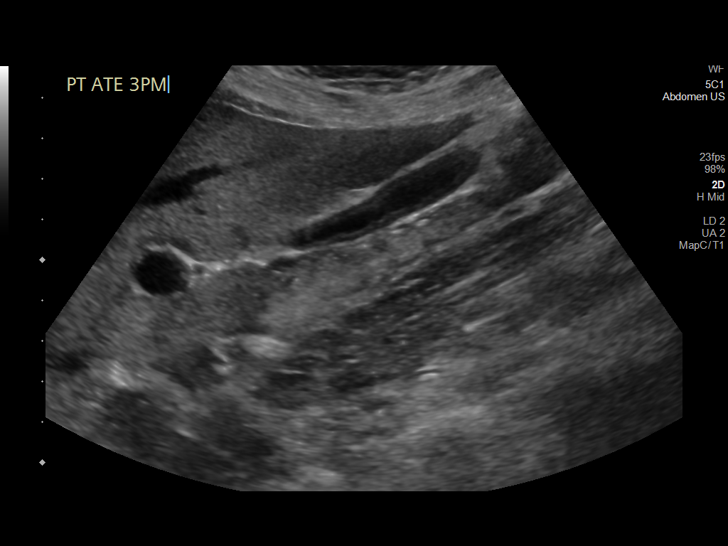
[im 7/74]
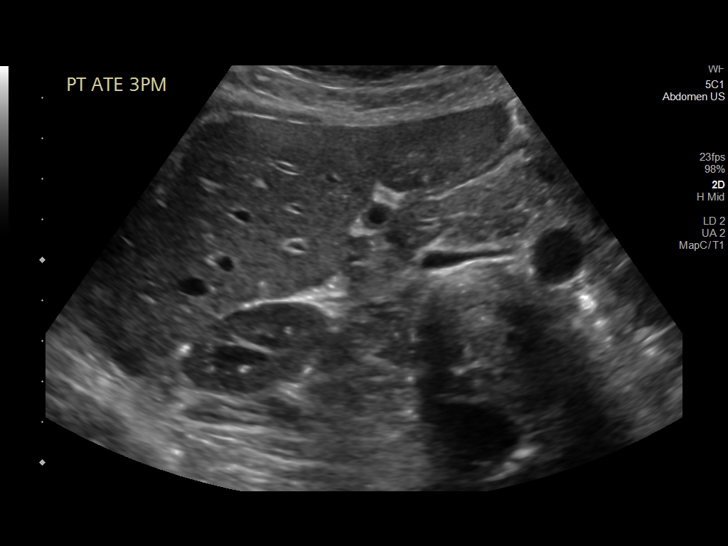
[im 13/74]
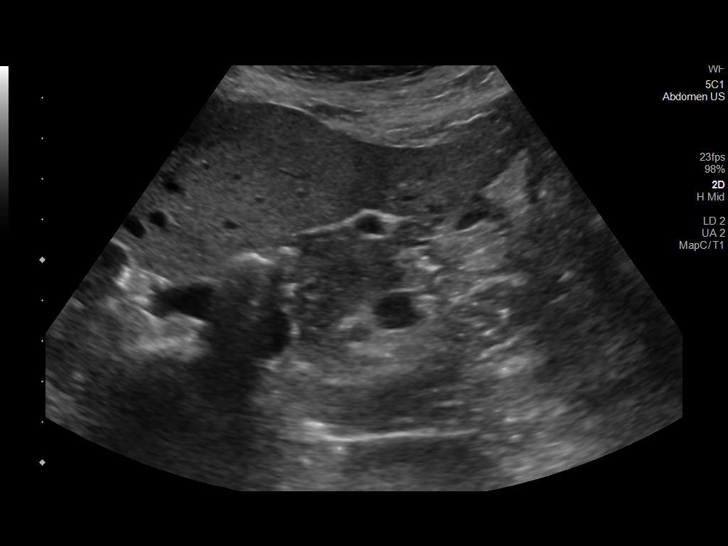
[im 19/74]
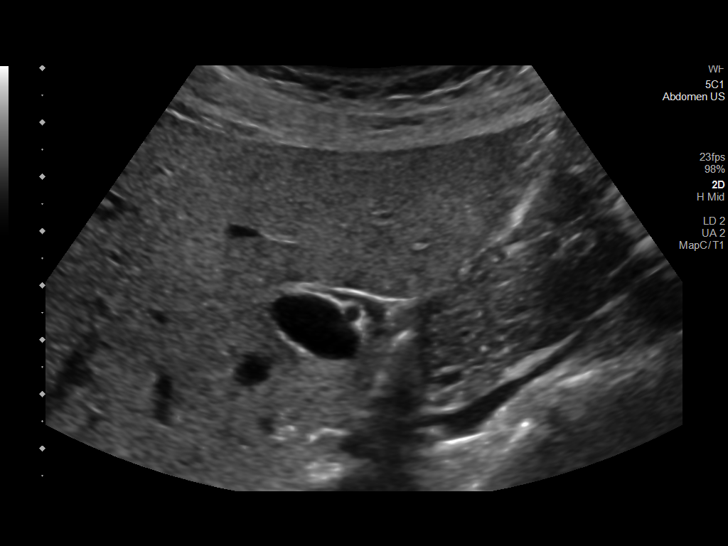
[im 25/74]
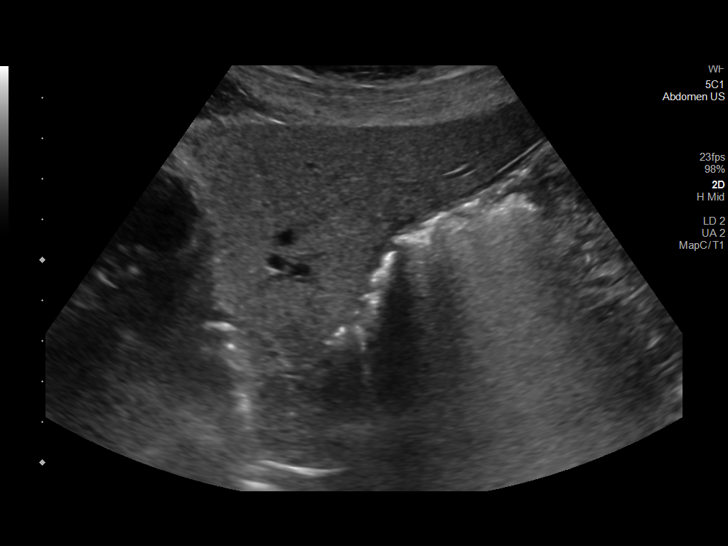
[im 28/74]
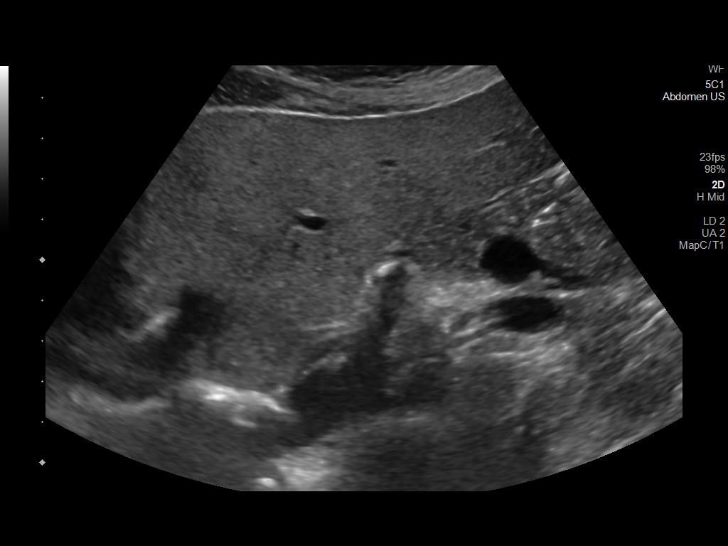
[im 34/74]
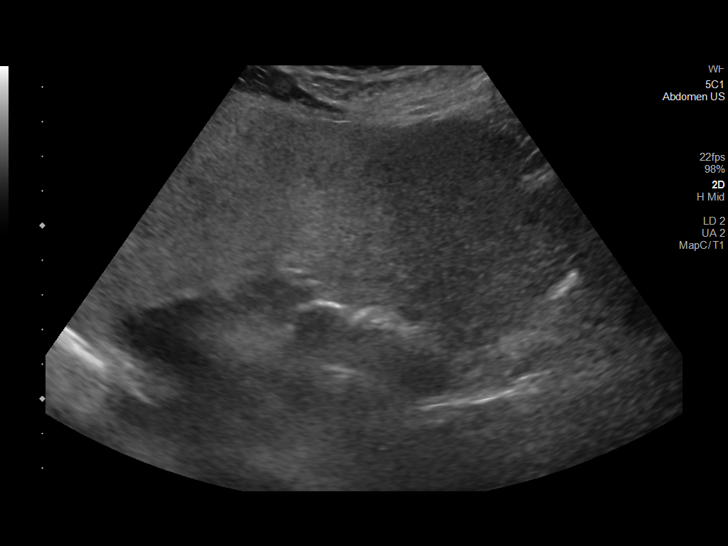
[im 40/74]
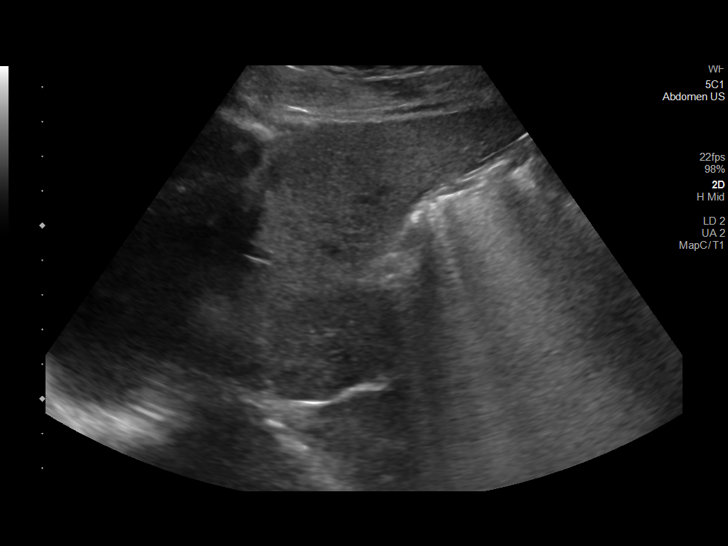
[im 46/74]
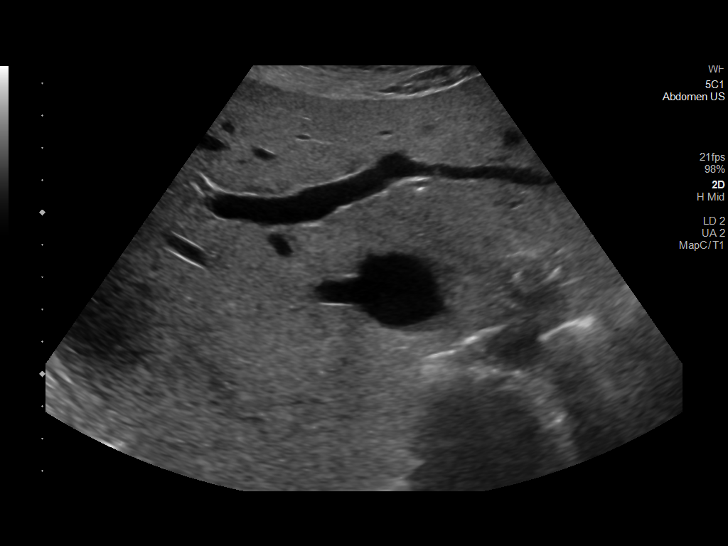
[im 49/74]
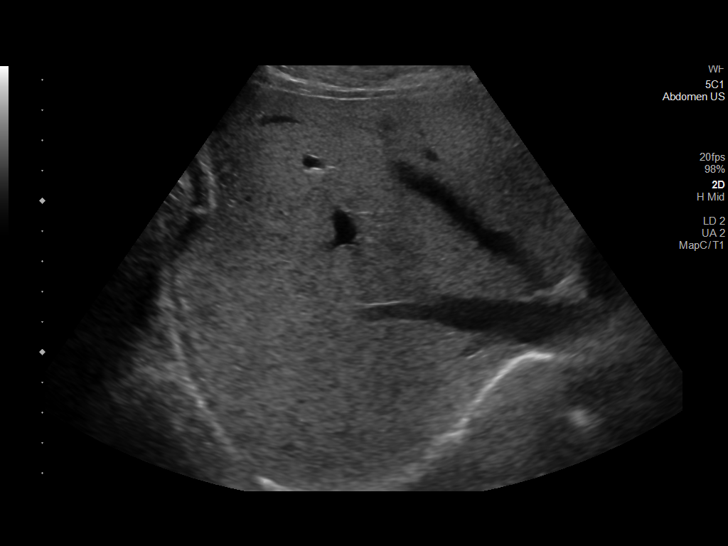
[im 55/74]
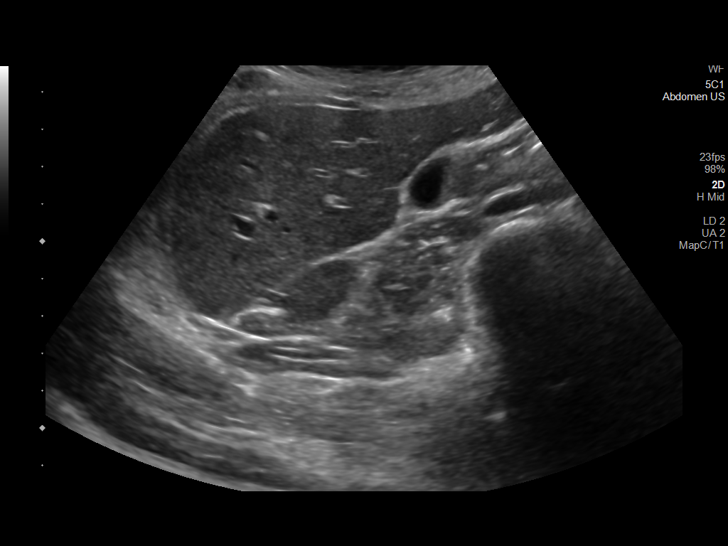
[im 61/74]
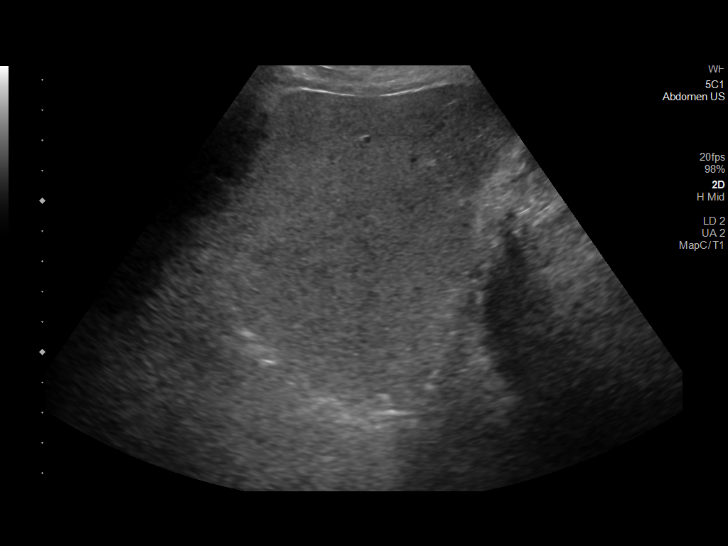
[im 67/74]
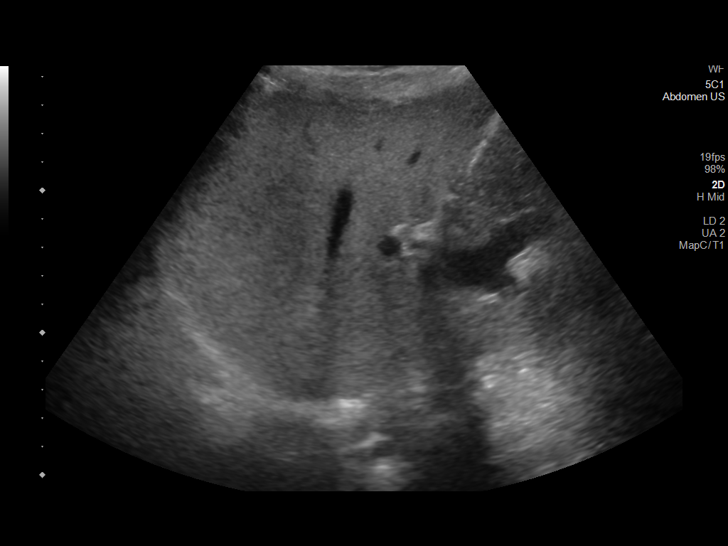
[im 74/74]
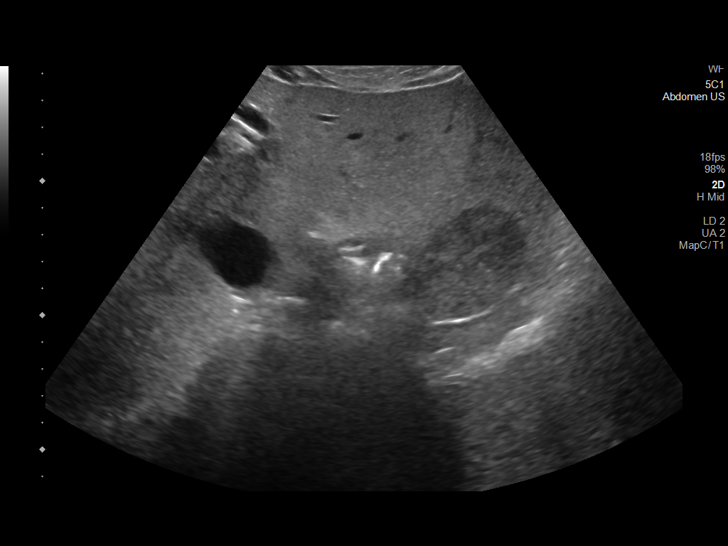

[14 of 25 positions shown; findings below may reference images not displayed]

FINDINGS: Gallbladder:

The gallbladder is contracted, but overall unremarkable. No
gallstones or wall thickening visualized. No sonographic Murphy sign
noted by sonographer.

Common bile duct:

Diameter: 0.1 cm

Liver:

Diffusely increased parenchymal echogenicity. There is a 3.9 x 3.0 x
4.4 cm circumscribed hypoechoic mass in the left lobe of the liver.
Portal vein is patent on color Doppler imaging with normal direction
of blood flow towards the liver.

Other: None.
IMPRESSION: Normal appearance of the gallbladder.

4.4 cm hypoechoic mass in the left lobe of the liver. Further
evaluation with CT or MRI of the abdomen, liver protocol, may be
considered on a nonemergent basis.

## 2022-10-17 NOTE — L&D Delivery Note (Signed)
Delivery Note At 2:48 AM a viable and healthy female was delivered via Vaginal, Spontaneous (Presentation: Left Occiput Anterior).  APGAR: 8, 9; weight  pending.   Placenta status: Spontaneous, Intact.  Cord: 3 vessels with the following complications: None.  Cord pH: na  Anesthesia: Local Episiotomy: Median due to fetal bradycardia Lacerations: 2nd degree Suture Repair: 2.0 vicryl rapide Est. Blood Loss (mL): 110  Mom to postpartum.  Baby to Couplet care / Skin to Skin.  Tyronda Vizcarrondo J 02/03/2023, 3:05 AM

## 2023-01-11 LAB — OB RESULTS CONSOLE GBS: GBS: NEGATIVE

## 2023-02-01 ENCOUNTER — Encounter (HOSPITAL_COMMUNITY): Payer: Self-pay | Admitting: *Deleted

## 2023-02-01 ENCOUNTER — Telehealth (HOSPITAL_COMMUNITY): Payer: Self-pay | Admitting: *Deleted

## 2023-02-01 NOTE — Telephone Encounter (Signed)
Preadmission screen  

## 2023-02-03 ENCOUNTER — Encounter (HOSPITAL_COMMUNITY): Payer: Self-pay | Admitting: Obstetrics

## 2023-02-03 ENCOUNTER — Inpatient Hospital Stay (HOSPITAL_COMMUNITY)
Admission: AD | Admit: 2023-02-03 | Discharge: 2023-02-04 | DRG: 807 | Disposition: A | Discharge status: Home / Self Care | Payer: Federal, State, Local not specified - PPO | Attending: Obstetrics and Gynecology | Admitting: Obstetrics and Gynecology

## 2023-02-03 DIAGNOSIS — Z3A38 38 weeks gestation of pregnancy: Secondary | ICD-10-CM | POA: Diagnosis not present

## 2023-02-03 DIAGNOSIS — O26893 Other specified pregnancy related conditions, third trimester: Secondary | ICD-10-CM | POA: Diagnosis present

## 2023-02-03 LAB — CBC
HCT: 35.8 % — ABNORMAL LOW (ref 36.0–46.0)
HCT: 34.2 % — ABNORMAL LOW (ref 36.0–46.0)
Hemoglobin: 12.3 g/dL (ref 12.0–15.0)
Hemoglobin: 11.6 g/dL — ABNORMAL LOW (ref 12.0–15.0)
MCH: 30.2 pg (ref 26.0–34.0)
MCH: 30.1 pg (ref 26.0–34.0)
MCHC: 34.4 g/dL (ref 30.0–36.0)
MCHC: 33.9 g/dL (ref 30.0–36.0)
MCV: 88 fL (ref 80.0–100.0)
MCV: 88.8 fL (ref 80.0–100.0)
Platelets: 142 10*3/uL — ABNORMAL LOW (ref 150–400)
Platelets: 135 10*3/uL — ABNORMAL LOW (ref 150–400)
RBC: 4.07 MIL/uL (ref 3.87–5.11)
RBC: 3.85 MIL/uL — ABNORMAL LOW (ref 3.87–5.11)
RDW: 13.2 % (ref 11.5–15.5)
RDW: 13.2 % (ref 11.5–15.5)
WBC: 11.3 10*3/uL — ABNORMAL HIGH (ref 4.0–10.5)
WBC: 18.8 10*3/uL — ABNORMAL HIGH (ref 4.0–10.5)
nRBC: 0 % (ref 0.0–0.2)
nRBC: 0 % (ref 0.0–0.2)

## 2023-02-03 LAB — TYPE AND SCREEN
ABO/RH(D): O POS
Antibody Screen: NEGATIVE

## 2023-02-03 LAB — HIV ANTIBODY (ROUTINE TESTING W REFLEX): HIV Screen 4th Generation wRfx: NONREACTIVE

## 2023-02-03 LAB — RPR: RPR Ser Ql: NONREACTIVE

## 2023-02-03 MED ORDER — LACTATED RINGERS IV SOLN: 500.0000 mL | INTRAVENOUS | Status: DC | PRN

## 2023-02-03 MED ORDER — SERTRALINE HCL 100 MG PO TABS
100.0000 mg | ORAL_TABLET | Freq: Every day | ORAL | Status: DC
  Administered 2023-02-03 – 2023-02-04 (×2): 100 mg via ORAL
  Filled 2023-02-03 (×2): qty 1

## 2023-02-03 MED ORDER — ONDANSETRON HCL 4 MG PO TABS: 4.0000 mg | ORAL_TABLET | ORAL | Status: DC | PRN

## 2023-02-03 MED ORDER — OXYTOCIN-SODIUM CHLORIDE 30-0.9 UT/500ML-% IV SOLN
INTRAVENOUS | Status: AC
  Filled 2023-02-03: qty 500

## 2023-02-03 MED ORDER — SIMETHICONE 80 MG PO CHEW: 80.0000 mg | CHEWABLE_TABLET | ORAL | Status: DC | PRN

## 2023-02-03 MED ORDER — OXYTOCIN 10 UNIT/ML IJ SOLN
INTRAMUSCULAR | Status: AC
  Filled 2023-02-03: qty 1

## 2023-02-03 MED ORDER — ZOLPIDEM TARTRATE 5 MG PO TABS: 5.0000 mg | ORAL_TABLET | Freq: Every evening | ORAL | Status: DC | PRN

## 2023-02-03 MED ORDER — LIDOCAINE HCL (PF) 1 % IJ SOLN
INTRAMUSCULAR | Status: AC
  Administered 2023-02-03: 30 mL
  Filled 2023-02-03: qty 30

## 2023-02-03 MED ORDER — FENTANYL CITRATE (PF) 100 MCG/2ML IJ SOLN
INTRAMUSCULAR | Status: AC
  Filled 2023-02-03: qty 2

## 2023-02-03 MED ORDER — COCONUT OIL OIL
1.0000 | TOPICAL_OIL | Status: DC | PRN
  Administered 2023-02-03: 1 via TOPICAL

## 2023-02-03 MED ORDER — OXYCODONE-ACETAMINOPHEN 5-325 MG PO TABS
1.0000 | ORAL_TABLET | ORAL | Status: DC | PRN
  Administered 2023-02-03 (×2): 1 via ORAL
  Filled 2023-02-03: qty 1

## 2023-02-03 MED ORDER — OXYTOCIN BOLUS FROM INFUSION
333.0000 mL | Freq: Once | INTRAVENOUS | Status: DC
  Administered 2023-02-03: 333 mL via INTRAVENOUS

## 2023-02-03 MED ORDER — OXYCODONE-ACETAMINOPHEN 5-325 MG PO TABS: 2.0000 | ORAL_TABLET | ORAL | Status: DC | PRN

## 2023-02-03 MED ORDER — PRENATAL MULTIVITAMIN CH
1.0000 | ORAL_TABLET | Freq: Every day | ORAL | Status: DC
  Administered 2023-02-03 – 2023-02-04 (×2): 1 via ORAL
  Filled 2023-02-03 (×2): qty 1

## 2023-02-03 MED ORDER — ACETAMINOPHEN 325 MG PO TABS: 650.0000 mg | ORAL_TABLET | ORAL | Status: DC | PRN

## 2023-02-03 MED ORDER — ONDANSETRON HCL 4 MG/2ML IJ SOLN: 4.0000 mg | Freq: Four times a day (QID) | INTRAMUSCULAR | Status: DC | PRN

## 2023-02-03 MED ORDER — OXYTOCIN-SODIUM CHLORIDE 30-0.9 UT/500ML-% IV SOLN: 2.5000 [IU]/h | INTRAVENOUS | Status: DC

## 2023-02-03 MED ORDER — WITCH HAZEL-GLYCERIN EX PADS: 1.0000 | MEDICATED_PAD | CUTANEOUS | Status: DC | PRN

## 2023-02-03 MED ORDER — SENNOSIDES-DOCUSATE SODIUM 8.6-50 MG PO TABS
2.0000 | ORAL_TABLET | Freq: Every day | ORAL | Status: DC
  Administered 2023-02-04: 2 via ORAL
  Filled 2023-02-03: qty 2

## 2023-02-03 MED ORDER — TETANUS-DIPHTH-ACELL PERTUSSIS 5-2.5-18.5 LF-MCG/0.5 IM SUSY: 0.5000 mL | PREFILLED_SYRINGE | Freq: Once | INTRAMUSCULAR | Status: DC

## 2023-02-03 MED ORDER — BENZOCAINE-MENTHOL 20-0.5 % EX AERO
1.0000 | INHALATION_SPRAY | CUTANEOUS | Status: DC | PRN
  Administered 2023-02-03: 1 via TOPICAL
  Filled 2023-02-03: qty 56

## 2023-02-03 MED ORDER — DIPHENHYDRAMINE HCL 25 MG PO CAPS: 25.0000 mg | ORAL_CAPSULE | Freq: Four times a day (QID) | ORAL | Status: DC | PRN

## 2023-02-03 MED ORDER — DIBUCAINE (PERIANAL) 1 % EX OINT: 1.0000 | TOPICAL_OINTMENT | CUTANEOUS | Status: DC | PRN

## 2023-02-03 MED ORDER — OXYCODONE-ACETAMINOPHEN 5-325 MG PO TABS
1.0000 | ORAL_TABLET | ORAL | Status: DC | PRN
  Administered 2023-02-03 – 2023-02-04 (×4): 1 via ORAL
  Filled 2023-02-03 (×4): qty 1

## 2023-02-03 MED ORDER — ACETAMINOPHEN 325 MG PO TABS
650.0000 mg | ORAL_TABLET | ORAL | Status: DC | PRN
  Administered 2023-02-03 – 2023-02-04 (×3): 650 mg via ORAL
  Filled 2023-02-03 (×4): qty 2

## 2023-02-03 MED ORDER — SOD CITRATE-CITRIC ACID 500-334 MG/5ML PO SOLN: 30.0000 mL | ORAL | Status: DC | PRN

## 2023-02-03 MED ORDER — ONDANSETRON HCL 4 MG/2ML IJ SOLN: 4.0000 mg | INTRAMUSCULAR | Status: DC | PRN

## 2023-02-03 MED ORDER — LIDOCAINE HCL (PF) 1 % IJ SOLN
30.0000 mL | INTRAMUSCULAR | Status: AC | PRN
  Administered 2023-02-03: 30 mL via SUBCUTANEOUS
  Filled 2023-02-03: qty 30

## 2023-02-03 MED ORDER — METHYLERGONOVINE MALEATE 0.2 MG/ML IJ SOLN: 0.2000 mg | INTRAMUSCULAR | Status: DC | PRN

## 2023-02-03 MED ORDER — LACTATED RINGERS IV SOLN: INTRAVENOUS | Status: DC

## 2023-02-03 MED ORDER — METHYLERGONOVINE MALEATE 0.2 MG PO TABS: 0.2000 mg | ORAL_TABLET | ORAL | Status: DC | PRN

## 2023-02-03 MED ORDER — IBUPROFEN 600 MG PO TABS
600.0000 mg | ORAL_TABLET | Freq: Four times a day (QID) | ORAL | Status: DC
  Administered 2023-02-03 – 2023-02-04 (×6): 600 mg via ORAL
  Filled 2023-02-03 (×6): qty 1

## 2023-02-03 MED ORDER — FLEET ENEMA 7-19 GM/118ML RE ENEM: 1.0000 | ENEMA | RECTAL | Status: DC | PRN

## 2023-02-03 MED ORDER — FENTANYL CITRATE (PF) 100 MCG/2ML IJ SOLN
100.0000 ug | Freq: Once | INTRAMUSCULAR | Status: AC
  Administered 2023-02-03: 100 ug via INTRAVENOUS

## 2023-02-03 NOTE — H&P (Signed)
Kayla Gordon is a 37 y.o. female presenting for labor. OB History     Gravida  2   Para  1   Term  1   Preterm      AB      Living  1      SAB      IAB      Ectopic      Multiple  0   Live Births  1          Past Medical History:  Diagnosis Date   Medical history non-contributory    Past Surgical History:  Procedure Laterality Date   NO PAST SURGERIES     Family History: family history includes Brain cancer in her paternal grandmother; Heart attack in her maternal grandfather; Liver cancer in her paternal grandmother; Lung cancer in her paternal grandmother; Stroke in her maternal grandmother. Social History:  reports that she has never smoked. She has never used smokeless tobacco. She reports that she does not currently use alcohol. She reports that she does not use drugs.     Maternal Diabetes: No Genetic Screening: Normal Maternal Ultrasounds/Referrals: Normal Fetal Ultrasounds or other Referrals:  None Maternal Substance Abuse:  No Significant Maternal Medications:  None Significant Maternal Lab Results:  Group B Strep negative Number of Prenatal Visits:greater than 3 verified prenatal visits Other Comments:  None  Review of Systems  Constitutional: Negative.   All other systems reviewed and are negative.  Maternal Medical History:  Reason for admission: Contractions.   Contractions: Onset was 1-2 hours ago.   Fetal activity: Perceived fetal activity is normal.   Last perceived fetal movement was within the past hour.   Prenatal complications: no prenatal complications Prenatal Complications - Diabetes: none.   Dilation: 10 Effacement (%): 100 Station: Plus 3 Exam by:: Dr. Lanae Crumbly unknown if currently breastfeeding. Maternal Exam:  Uterine Assessment: Contraction strength is moderate.  Contraction frequency is regular.  Abdomen: Patient reports no abdominal tenderness. Fetal presentation: vertex Introitus: Normal vulva. Normal  vagina.  Ferning test: positive.  Amniotic fluid character: clear. Pelvis: adequate for delivery.     Physical Exam Constitutional:      Appearance: Normal appearance. She is normal weight.  HENT:     Head: Atraumatic.  Cardiovascular:     Rate and Rhythm: Normal rate.     Pulses: Normal pulses.  Pulmonary:     Effort: Pulmonary effort is normal.  Abdominal:     Palpations: Abdomen is soft.  Genitourinary:    General: Normal vulva.  Musculoskeletal:        General: Normal range of motion.     Cervical back: Normal range of motion and neck supple.  Skin:    General: Skin is warm.  Neurological:     General: No focal deficit present.     Mental Status: She is alert.  Psychiatric:        Mood and Affect: Mood normal.     Prenatal labs: ABO, Rh: --/--/PENDING (04/19 0225) Antibody: PENDING (04/19 0225) Rubella: Immune (10/04 0000) RPR: Nonreactive (10/04 0000)  HBsAg: Negative (10/04 0000)  HIV: Non-reactive (10/04 0000)  GBS: Negative/-- (03/27 0000)   Assessment/Plan: Term IUP Active labor admit   Rosmarie Esquibel J 02/03/2023, 3:03 AM

## 2023-02-03 NOTE — Progress Notes (Signed)
MOB was referred for history of depression. * Referral screened out by Clinical Social Worker because none of the following criteria appear to apply: ~ History of anxiety/depression during this pregnancy, or of post-partum depression following prior delivery. ~ Diagnosis of anxiety and/or depression within last 3 years OR * MOB's symptoms currently being treated with medication and/or therapy. Per chart review, MOB is currently prescribed/taking Sertraline.   Please contact the Clinical Social Worker if needs arise, by [facility] request, or if MOB scores greater than 9/yes to question 10 on Edinburgh Postpartum Depression Screen.  Celso Sickle, LCSW Clinical Social Worker [facility] Cell#: [phone]

## 2023-02-03 NOTE — Lactation Note (Signed)
This note was copied from a baby's chart. Lactation Consultation Note  Patient Name: Kayla Gordon [id number] Date: 02/03/2023 Age:37 hours, P2 , 1st baby exclusively pumped  Reason for consult: Initial assessment;Early term 37-38.6wks Per mom baby latched right after delivery and fed well.  LC reviewed and updated the doc flow sheets with parents. WNL for age.  Baby already latched as LC entered the room with flanged lips.  LC discussed nutritive vs non - nutritive feeding patterns and the importance of watching the baby for hanging out latched.  LC answered breast feeding questions from mom and dad .   Maternal Data Does the patient have breastfeeding experience prior to this delivery?: Yes How long did the patient breastfeed?: permom with her baby DL and mom pumped exclusively with excellent milk supply x 4 months  Feeding Mother's Current Feeding Choice: Breast Milk  LATCH Score Latch:  (latched with depth)  Audible Swallowing:  (swallows)     Comfort (Breast/Nipple):  (per mom comfortable)  Hold (Positioning):  (mom had latched the baby)      Interventions Interventions: Breast feeding basics reviewed;Education;LC Services brochure  Discharge Pump: DEBP;Manual;Personal  Consult Status Consult Status: Follow-up Date: 02/04/23 Follow-up type: In-patient    Kayla Gordon 02/03/2023, 11:46 AM

## 2023-02-03 NOTE — MAU Note (Addendum)
Kayla Gordon a 37 y.o. at [redacted]w[redacted]d here in MAU reporting: ctx. Pt in severe pain and unable to talk a lot. Pt reports possible rupture of membranes at 0120. Pt support person states it was clear fluid. CNM at bedside. Billy Coast, MD called for admission orders. Pt transferred to L&D with 2 RN and CNM.   FHT:165 Lab orders placed from triage:  mau labor

## 2023-02-04 NOTE — Lactation Note (Signed)
This note was copied from a baby's chart. Lactation Consultation Note  Patient Name: Boy Carliss Porcaro [id number] Date: 02/04/2023 Age:37 hours   LC attempted to visit with the birth parent, but she was in the shower. Lactation will follow up later.  Maternal Data    Feeding    LATCH Score                    Lactation Tools Discussed/Used    Interventions    Discharge    Consult Status      Delene Loll 02/04/2023, 2:40 PM

## 2023-02-04 NOTE — Lactation Note (Signed)
This note was copied from a baby's chart. Lactation Consultation Note  Patient Name: Kayla Gordon [id number] Date: 02/04/2023 Age:37 hours Reason for consult: Follow-up assessment;Difficult latch;Early term 37-38.6wks;Breastfeeding assistance;Infant weight loss  LC entered the room and the birth parent was holding the infant.  Per the birth parent the infant has been doing well at the breast, but the infant has sucking blisters on his lips.  LC asked the birth parent if she was feeling pinching with the latch.  The birth parent stated that her nipple is pinched when the infant comes off the breast.  The infant was too sleepy to latch.  LC encouraged the birth parent to tug down on the infant's chin and flip his upper lip.  LC educated the parents on supplementation guidelines, milk production, pace bottle feeding, engorgement, breast care, mastitis, warning signs, infant I/O, and outpatient lactation services.  LC answered questions about nutritive vs non nutritive sucking.  LC also spoke with the parents about proper formula preparation.  All questions were answered.   Infant Feeding Plan:  Breastfeed 8+ times in 24 hours according to feeding cues.  Put the infant to the breast prior to supplementing.  Supplement the infant according to supplementation guidelines.  Watch infant output and call the pediatrician with questions or concerns.  Call the outpatient [facility] for assistance with breastfeeding.  Prioritize maternal nutrition, rest, and hydration.    Feeding Mother's Current Feeding Choice: Breast Milk and Donor Milk   Lactation Tools Discussed/Used Tools: Pump Breast pump type: Double-Electric Breast Pump Pump Education: Setup, frequency, and cleaning;Milk Storage Reason for Pumping: mothers request to pump before discharge  Interventions Interventions: Education  Discharge Discharge Education: Engorgement and breast care;Warning signs for feeding baby;Outpatient  recommendation  Consult Status Consult Status: Complete Date: 02/04/23 Follow-up type: Call as needed    Delene Loll 02/04/2023, 5:30 PM

## 2023-02-04 NOTE — Discharge Summary (Signed)
Postpartum Discharge Summary  Date of Service updated 02/04/23     Patient Name: Kayla Gordon DOB: 01-Jan-1986 MRN: [id number]  Date of admission: 02/03/2023 Delivery date:02/03/2023  Delivering provider: Olivia Mackie  Date of discharge: 02/04/2023  Admitting diagnosis: Normal labor and delivery [O80] Intrauterine pregnancy: [redacted]w[redacted]d     Secondary diagnosis:  Principal Problem:   Postpartum care following vaginal delivery 4/19 Active Problems:   SVD (spontaneous vaginal delivery)   Normal labor and delivery   Second degree perineal laceration  Additional problems: n/a    Discharge diagnosis: Term Pregnancy Delivered                                              Post partum procedures: n/a Augmentation: N/A Complications: None  Hospital course: Onset of Labor With Vaginal Delivery      37 y.o. yo [id number] at [redacted]w[redacted]d was admitted in Active Labor on 02/03/2023. Labor course was complicated bynone  Membrane Rupture Time/Date: 2:32 AM ,   Delivery Method:Vaginal, Spontaneous  Episiotomy: Median  Lacerations:  2nd degree  Patient had a postpartum course complicated by none.  She is ambulating, tolerating a regular diet, passing flatus, and urinating well. Patient is discharged home in stable condition on 02/04/23.  Newborn Data: Birth date:02/03/2023  Birth time:2:48 AM  Gender:Female  Living status:Living  Apgars:9 ,9  Weight:3320 g   Magnesium Sulfate received: No BMZ received: No Rhophylac:N/A MMR:N/A T-DaP:Given prenatally   Physical exam  Vitals:   02/03/23 1027 02/03/23 1451 02/03/23 1820 02/03/23 2024  BP: 97/60 (!) 106/59 103/67 120/82  Pulse: 76 61 68 68  Resp: Temp: 98.3 F (36.8 C) 98.4 F (36.9 C) 98.1 F (36.7 C) 98.1 F (36.7 C)  TempSrc: Oral Oral Oral Oral  SpO2: 97% 99% 98% 99%  Weight:      Height:       General: alert and no distress Lochia: appropriate Uterine Fundus: firm Incision: N/A DVT Evaluation: No evidence of DVT  seen on physical exam. Labs: Lab Results  Component Value Date   WBC 18.8 (H) 02/03/2023   HGB 11.6 (L) 02/03/2023   HCT 34.2 (L) 02/03/2023   MCV 88.8 02/03/2023   PLT 135 (L) 02/03/2023      Latest Ref Rng & Units 06/27/2021    5:14 PM  CMP  Glucose 70 - 99 mg/dL 147   BUN 6 - 20 mg/dL 9   Creatinine 8.29 - 5.62 mg/dL 1.30   Sodium 865 - 784 mmol/L 136   Potassium 3.5 - 5.1 mmol/L 3.3   Chloride 98 - 111 mmol/L 107   CO2 22 - 32 mmol/L 22   Calcium 8.9 - 10.3 mg/dL 8.8   Total Protein 6.5 - 8.1 g/dL 7.0   Total Bilirubin 0.3 - 1.2 mg/dL 0.7   Alkaline Phos 38 - 126 U/L 72   AST 15 - 41 U/L 19   ALT 0 - 44 U/L 16    Edinburgh Score:    02/03/2023   12:47 PM  Edinburgh Postnatal Depression Scale Screening Tool  I have been able to laugh and see the funny side of things. 0  I have looked forward with enjoyment to things. 0  I have blamed myself unnecessarily when things went wrong. 1  I have been anxious or worried for no good reason.  1  I have felt scared or panicky for no good reason. 0  Things have been getting on top of me. 1  I have been so unhappy that I have had difficulty sleeping. 0  I have felt sad or miserable. 1  I have been so unhappy that I have been crying. 0  The thought of harming myself has occurred to me. 0  Edinburgh Postnatal Depression Scale Total 4      After visit meds:  Allergies as of 02/04/2023   No Known Allergies      Medication List     TAKE these medications    benzocaine-Menthol 20-0.5 % Aero Commonly known as: DERMOPLAST Apply 1 application topically as needed for irritation (perineal discomfort).   coconut oil Oil Apply 1 application topically as needed.   DHA PO Take 2 capsules by mouth daily.   ibuprofen 600 MG tablet Commonly known as: ADVIL Take 1 tablet (600 mg total) by mouth every 6 (six) hours.   Prenate Elite 20-0.6-0.4 MG Tabs Take 1 tablet by mouth daily.   psyllium 0.52 g capsule Commonly known as:  REGULOID Take 0.52 g by mouth daily.               Discharge Care Instructions  (From admission, onward)           Start     Ordered   02/04/23 0000  Discharge wound care:       Comments: Sitz baths 2 times /day with warm water x 1 week. May add herbals: 1 ounce dried comfrey leaf* 1 ounce calendula flowers 1 ounce lavender flowers  Supplies can be found online at [employer] sources at [employer], [employer]  1/2 ounce dried uva ursi leaves 1/2 ounce witch hazel blossoms (if you can find them) 1/2 ounce dried sage leaf 1/2 cup sea salt Directions: Bring 2 quarts of water to a boil. Turn off heat, and place 1 ounce (approximately 1 large handful) of the above mixed herbs (not the salt) into the pot. Steep, covered, for 30 minutes.  Strain the liquid well with a fine mesh strainer, and discard the herb material. Add 2 quarts of liquid to the tub, along with the 1/2 cup of salt. This medicinal liquid can also be made into compresses and peri-rinses.   02/04/23 1433             Discharge home in stable condition Infant Feeding: Breast Infant Disposition:home with mother Discharge instruction: per After Visit Summary and Postpartum booklet. Activity: Advance as tolerated. Pelvic rest for 6 weeks.  Diet: routine diet Anticipated Birth Control: Unsure Postpartum Appointment:6 weeks Additional Postpartum F/U:  n/a Future Appointments:No future appointments. Follow up Visit:  Patient to follow up with Dr. Amado Nash at [facility] in 6 weeks or sooner as needed    02/04/2023 Kilynn Fitzsimmons A Loc Feinstein, DO

## 2023-02-14 ENCOUNTER — Telehealth (HOSPITAL_COMMUNITY): Payer: Self-pay | Admitting: *Deleted

## 2023-02-14 NOTE — Telephone Encounter (Signed)
Mom reports feeling good. No concerns about herself at this time. EPDS=2 [facility] score=4) Mom reports baby is doing well. Feeding, peeing, and pooping without difficulty. Safe sleep reviewed. Mom reports no concerns about baby at present.  Duffy Rhody, RN 02-14-2023 at 10:44am

## 2023-02-18 ENCOUNTER — Inpatient Hospital Stay (HOSPITAL_COMMUNITY): Payer: Federal, State, Local not specified - PPO

## 2023-02-18 ENCOUNTER — Inpatient Hospital Stay (HOSPITAL_COMMUNITY)
Admission: RE | Admit: 2023-02-18 | Payer: Federal, State, Local not specified - PPO | Source: Home / Self Care | Admitting: Obstetrics and Gynecology
